# Patient Record
Sex: Female | Born: 1995 | Race: Black or African American | Hispanic: No | Marital: Single | Smoking: Current every day smoker
Health system: Southern US, Community
[De-identification: ages and names within clinical notes are randomized; demographics above are authoritative.]

## PROBLEM LIST (undated history)

## (undated) DIAGNOSIS — J45909 Unspecified asthma, uncomplicated: Secondary | ICD-10-CM

## (undated) HISTORY — DX: Unspecified asthma, uncomplicated: J45.909

## (undated) HISTORY — PX: REPLACEMENT TOTAL KNEE: SUR1224

---
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
## 2015-03-28 ENCOUNTER — Other Ambulatory Visit: Payer: Self-pay | Admitting: Neurology

## 2015-03-28 MED ORDER — ALBUTEROL SULFATE HFA 108 (90 BASE) MCG/ACT IN AERS
2.0000 | INHALATION_SPRAY | RESPIRATORY_TRACT | Status: DC | PRN
Start: 2015-03-28 — End: 2015-03-28

## 2015-03-28 MED ORDER — ALBUTEROL SULFATE HFA 108 (90 BASE) MCG/ACT IN AERS: 2.0000 | INHALATION_SPRAY | RESPIRATORY_TRACT | Status: AC | PRN

## 2015-03-28 MED ORDER — ALBUTEROL SULFATE HFA 108 (90 BASE) MCG/ACT IN AERS: 2.0000 | INHALATION_SPRAY | RESPIRATORY_TRACT | Status: DC | PRN

## 2015-11-11 DIAGNOSIS — N926 Irregular menstruation, unspecified: Secondary | ICD-10-CM | POA: Insufficient documentation

## 2015-11-11 DIAGNOSIS — N915 Oligomenorrhea, unspecified: Secondary | ICD-10-CM | POA: Insufficient documentation

## 2016-09-24 ENCOUNTER — Telehealth: Payer: Self-pay | Admitting: Pediatrics

## 2016-09-24 NOTE — Telephone Encounter (Signed)
Patient is requesting a statement from all her dates of service. She said it was only a couple. I had to look her up in [employer], and it shows her last date of service was 03-15-14 with Dr. Beaulah DinningBardelas. Address: [address], [place] [id number].

## 2016-09-24 NOTE — Telephone Encounter (Signed)
Will mail printout tomorrow

## 2017-12-02 ENCOUNTER — Ambulatory Visit (INDEPENDENT_AMBULATORY_CARE_PROVIDER_SITE_OTHER): Payer: 59 | Admitting: Family Medicine

## 2017-12-02 ENCOUNTER — Encounter: Payer: Self-pay | Admitting: Family Medicine

## 2017-12-02 VITALS — BP 122/70 | HR 85 | Temp 98.7°F | Ht 65.0 in | Wt 174.4 lb

## 2017-12-02 DIAGNOSIS — J4520 Mild intermittent asthma, uncomplicated: Secondary | ICD-10-CM | POA: Diagnosis not present

## 2017-12-02 DIAGNOSIS — R112 Nausea with vomiting, unspecified: Secondary | ICD-10-CM

## 2017-12-02 DIAGNOSIS — Z131 Encounter for screening for diabetes mellitus: Secondary | ICD-10-CM

## 2017-12-02 DIAGNOSIS — R635 Abnormal weight gain: Secondary | ICD-10-CM | POA: Diagnosis not present

## 2017-12-02 DIAGNOSIS — N926 Irregular menstruation, unspecified: Secondary | ICD-10-CM | POA: Diagnosis not present

## 2017-12-02 DIAGNOSIS — Z7689 Persons encountering health services in other specified circumstances: Secondary | ICD-10-CM | POA: Diagnosis not present

## 2017-12-02 LAB — COMPREHENSIVE METABOLIC PANEL
ALBUMIN: 4.1 g/dL (ref 3.5–5.2)
ALK PHOS: 53 U/L (ref 39–117)
ALT: 9 U/L (ref 0–35)
AST: 12 U/L (ref 0–37)
BUN: 8 mg/dL (ref 6–23)
CO2: 26 mEq/L (ref 19–32)
CREATININE: 0.73 mg/dL (ref 0.40–1.20)
Calcium: 9.3 mg/dL (ref 8.4–10.5)
Chloride: 106 mEq/L (ref 96–112)
GFR: 126.07 mL/min (ref >60.00)
GLUCOSE: 77 mg/dL (ref 70–99)
POTASSIUM: 3.8 meq/L (ref 3.5–5.1)
SODIUM: 138 meq/L (ref 135–145)
TOTAL PROTEIN: 6.8 g/dL (ref 6.0–8.3)
Total Bilirubin: 0.4 mg/dL (ref 0.2–1.2)

## 2017-12-02 LAB — HEMOGLOBIN A1C: Hgb A1c MFr Bld: 5.5 % (ref 4.6–6.5)

## 2017-12-02 LAB — CBC WITH DIFFERENTIAL/PLATELET
BASOS ABS: 0 10*3/uL (ref 0.0–0.1)
Basophils Relative: 0.7 % (ref 0.0–3.0)
EOS ABS: 0.1 10*3/uL (ref 0.0–0.7)
Eosinophils Relative: 2 % (ref 0.0–5.0)
HCT: 35.3 % — ABNORMAL LOW (ref 36.0–46.0)
HEMOGLOBIN: 12.1 g/dL (ref 12.0–15.0)
LYMPHS ABS: 2.6 10*3/uL (ref 0.7–4.0)
Lymphocytes Relative: 35.4 % (ref 12.0–46.0)
MCHC: 34.2 g/dL (ref 30.0–36.0)
MCV: 87 fl (ref 78.0–100.0)
MONO ABS: 0.4 10*3/uL (ref 0.1–1.0)
Monocytes Relative: 5.4 % (ref 3.0–12.0)
NEUTROS PCT: 56.5 % (ref 43.0–77.0)
Neutro Abs: 4.1 10*3/uL (ref 1.4–7.7)
Platelets: 253 10*3/uL (ref 150.0–400.0)
RBC: 4.06 Mil/uL (ref 3.87–5.11)
RDW: 12.8 % (ref 11.5–15.5)
WBC: 7.2 10*3/uL (ref 4.0–10.5)

## 2017-12-02 LAB — TSH: TSH: 0.6 u[IU]/mL (ref 0.35–4.50)

## 2017-12-02 LAB — T4, FREE: FREE T4: 0.96 ng/dL (ref 0.60–1.60)

## 2017-12-02 LAB — LIPASE: Lipase: 13 U/L (ref 11.0–59.0)

## 2017-12-02 NOTE — Patient Instructions (Addendum)
Try taking your birth control pills in the evening to see if this helps with your nausea and vomiting.  You should also keep a log of the things you are eating you throw up.  We will call you with the results of your labs in 5 to 7 days. Nausea and Vomiting, Adult Nausea is the feeling that you have an upset stomach or have to vomit. As nausea gets worse, it can lead to vomiting. Vomiting occurs when stomach contents are thrown up and out of the mouth. Vomiting can make you feel weak and cause you to become dehydrated. Dehydration can make you tired and thirsty, cause you to have a dry mouth, and decrease how often you urinate. Older adults and people with other diseases or a weak immune system are at higher risk for dehydration. It is important to treat your nausea and vomiting as told by your health care provider. Follow these instructions at home: Follow instructions from your health care provider about how to care for yourself at home. Eating and drinking Follow these recommendations as told by your health care provider:  Take an oral rehydration solution (ORS). This is a drink that is sold at pharmacies and retail stores.  Drink clear fluids in small amounts as you are able. Clear fluids include water, ice chips, diluted fruit juice, and low-calorie sports drinks.  Eat bland, easy-to-digest foods in small amounts as you are able. These foods include bananas, applesauce, rice, lean meats, toast, and crackers.  Avoid fluids that contain a lot of sugar or caffeine, such as energy drinks, sports drinks, and soda.  Avoid alcohol.  Avoid spicy or fatty foods.  General instructions  Drink enough fluid to keep your urine clear or pale yellow.  Wash your hands often. If soap and water are not available, use hand sanitizer.  Make sure that all people in your household wash their hands well and often.  Take over-the-counter and prescription medicines only as told by your health care  provider.  Rest at home while you recover.  Watch your condition for any changes.  Breathe slowly and deeply when you feel nauseated.  Keep all follow-up visits as told by your health care provider. This is important. Contact a health care provider if:  You have a fever.  You cannot keep fluids down.  Your symptoms get worse.  You have new symptoms.  Your nausea does not go away after two days.  You feel light-headed or dizzy.  You have a headache.  You have muscle cramps. Get help right away if:  You have pain in your chest, neck, arm, or jaw.  You feel extremely weak or you faint.  You have persistent vomiting.  You see blood in your vomit.  Your vomit looks like black coffee grounds.  You have bloody or black stools or stools that look like tar.  You have a severe headache, a stiff neck, or both.  You have a rash.  You have severe pain, cramping, or bloating in your abdomen.  You have trouble breathing or you are breathing very quickly.  Your heart is beating very quickly.  Your skin feels cold and clammy.  You feel confused.  You have pain when you urinate.  You have signs of dehydration, such as: ? Dark urine, very little urine, or no urine. ? Cracked lips. ? Dry mouth. ? Sunken eyes. ? Sleepiness. ? Weakness. These symptoms may represent a serious problem that is an emergency. Do not wait to  see if the symptoms will go away. Get medical help right away. Call your local emergency services (911 in the [place] Do not drive yourself to the hospital. This information is not intended to replace advice given to you by your health care provider. Make sure you discuss any questions you have with your health care provider. Document Released: 04/13/2005 Document Revised: 09/16/2015 Document Reviewed: 12/18/2014 Elsevier Interactive Patient Education  [employer]2018 [employer] Inc[employer]

## 2017-12-02 NOTE — Progress Notes (Signed)
Patient presents to clinic today to establish care.  SUBJECTIVE: PMH: Pt is a 22 yo female with pmh sig for asthma and irregular menses.  Patient is followed by OB/GYN, Dr. Pennie Rushing.  Patient was previously seen at [facility].  Nausea and vomiting: -Daily x1 year -Occurs mostly in the morning, but occasionally during the day. -Certain smells or hearing someone gag can make patient throw up. -Noted shortly after changing OCP to sronyx .10-0.02 consecutively x 3 months with 3 days off then restart -seen by OB/Gyn, UHcg neg. Advised to f/u with pcp but couldn't be seen for 8 months. -pt notes 20+ lbs wt gain in less than 1 yr.  Irregular menses: -followed by OB/Gyn -taking Sronyx .10-0.02 mg daily x 3 months   Asthma: -Endorses a.m. coughing -Denies history of heartburn -We will use albuterol inhaler as needed -Not currently taking allergy medication.  Allergies: NKDA  Past surgical history: ACL knee repair 2011 and 2013  Social history: Patient is single.  She is currently employed as a Museum/gallery conservator.  She is also a Consulting civil engineer at [employer].  Patient endorses alcohol, tobacco, recreational drug use.   Health Maintenance: Dental --Dr. Veneta Penton   Family medical history: Mom-alive, HTN Dad-alive, diabetes, HTN brother-matt MGM-arthritis, HLD, HTN MGF-HLD, HTN PGM-deceased, diabetes, HTN  History reviewed. No pertinent past medical history.  History reviewed. No pertinent surgical history.  Current Outpatient Medications on File Prior to Visit  Medication Sig Dispense Refill  . levonorgestrel-ethinyl estradiol (SRONYX) 0.1-20 MG-MCG tablet Take 1 tablet by mouth daily.    Marland Kitchen albuterol (PROAIR HFA) 108 (90 BASE) MCG/ACT inhaler Inhale 2 puffs into the lungs every 4 (four) hours as needed for wheezing or shortness of breath. 1 Inhaler 0   No current facility-administered medications on file prior to visit.      No Known Allergies  History reviewed. No pertinent family history.  Social History   Socioeconomic History  . Marital status: Single    Spouse name: Not on file  . Number of children: Not on file  . Years of education: Not on file  . Highest education level: Not on file  Occupational History  . Not on file  Social Needs  . Financial resource strain: Not on file  . Food insecurity:    Worry: Not on file    Inability: Not on file  . Transportation needs:    Medical: Not on file    Non-medical: Not on file  Tobacco Use  . Smoking status: Not on file  Substance and Sexual Activity  . Alcohol use: Not on file  . Drug use: Not on file  . Sexual activity: Not on file  Lifestyle  . Physical activity:    Days per week: Not on file    Minutes per session: Not on file  . Stress: Not on file  Relationships  . Social connections:    Talks on phone: Not on file    Gets together: Not on file    Attends religious service: Not on file    Active member of club or organization: Not on file    Attends meetings of clubs or organizations: Not on file    Relationship status: Not on file  . Intimate partner violence:    Fear of current or ex partner: Not on file    Emotionally abused: Not on file    Physically abused: Not on file    Forced sexual activity:  Not on file  Other Topics Concern  . Not on file  Social History Narrative  . Not on file    ROS General: Denies fever, chills, night sweats, changes in weight, changes in appetite  + weight gain HEENT: Denies headaches, ear pain, changes in vision, rhinorrhea, sore throat CV: Denies CP, palpitations, SOB, orthopnea Pulm: Denies SOB, cough, wheezing GI: Denies abdominal pain, diarrhea, constipation + nausea, vomiting daily GU: Denies dysuria, hematuria, frequency, vaginal discharge + H/o irregular menses Msk: Denies muscle cramps, joint pains Neuro: Denies weakness, numbness, tingling Skin: Denies rashes, bruising Psych:  Denies depression, anxiety, hallucinations  BP 122/70 (BP Location: Left Arm, Patient Position: Sitting, Cuff Size: Normal)   Pulse 85   Temp 98.7 F (37.1 C) (Oral)   Ht 5\' 5"  (1.651 m)   Wt 174 lb 6.4 oz (79.1 kg)   SpO2 98%   BMI 29.02 kg/m   Physical Exam Gen. Pleasant, well developed, well-nourished, in NAD HEENT - /AT, PERRL, no scleral icterus, no nasal drainage, pharynx without erythema or exudate.  TMs normal bilaterally.  No cervical lymphadenopathy. Lungs: no use of accessory muscles, CTAB, no wheezes, rales or rhonchi Cardiovascular: RRR, No r/g/m, no peripheral edema Abdomen: BS present, soft, nontender, nondistended, no hepatosplenomegaly Neuro:  A&Ox3, CN II-XII intact, normal gait Skin:  Warm, dry, intact, no lesions   No results found for this or any previous visit (from the past 2160 hour(s)).  Assessment/Plan: Non-intractable vomiting with nausea, unspecified vomiting type  -DDX includes GERD, cannabis hyperemesis syndrome, pancreatitis/cholecystitis but would expect additional symptoms including pain, food allergy, medication side effects such as OCPs. -Patient to try taking OCPs at night to see if nausea/vomiting improves -Will obtain labs. -Consider trial of PPI, though patient denies H/o reflux. -Patient to keep a diary of food she eats and episodes of emesis. - Plan: CBC with Differential/Platelet, Comprehensive metabolic panel, Lipase  Irregular menses  -Continue following with OB/GYN - Plan: TSH, T4, Free  Mild intermittent asthma without complication -Continue albuterol inhaler as needed -Consider taking allergy medication daily -Avoid triggers  Encounter to establish care -We reviewed the PMH, PSH, FH, SH, Meds and Allergies. -We provided refills for any medications we will prescribe as needed. -We addressed current concerns per orders and patient instructions. -We have asked for records for pertinent exams, studies, vaccines and notes from  previous providers. -We have advised patient to follow up per instructions below.  Weight gain  - Plan: TSH, T4, Free  Screening for diabetes mellitus  - Plan: Hemoglobin A1c  Follow-up in 1 month  Abbe AmsterdamShannon Banks, MD

## 2017-12-22 ENCOUNTER — Ambulatory Visit (INDEPENDENT_AMBULATORY_CARE_PROVIDER_SITE_OTHER): Payer: 59 | Admitting: Internal Medicine

## 2017-12-22 ENCOUNTER — Encounter: Payer: Self-pay | Admitting: Internal Medicine

## 2017-12-22 VITALS — BP 126/82 | HR 76 | Temp 98.6°F | Wt 177.3 lb

## 2017-12-22 DIAGNOSIS — R1012 Left upper quadrant pain: Secondary | ICD-10-CM

## 2017-12-22 DIAGNOSIS — R1010 Upper abdominal pain, unspecified: Secondary | ICD-10-CM | POA: Diagnosis not present

## 2017-12-22 DIAGNOSIS — B009 Herpesviral infection, unspecified: Secondary | ICD-10-CM | POA: Insufficient documentation

## 2017-12-22 DIAGNOSIS — R112 Nausea with vomiting, unspecified: Secondary | ICD-10-CM

## 2017-12-22 LAB — POCT URINALYSIS DIPSTICK
BILIRUBIN UA: NEGATIVE
Blood, UA: NEGATIVE
GLUCOSE UA: NEGATIVE
KETONES UA: NEGATIVE
Leukocytes, UA: NEGATIVE
Nitrite, UA: NEGATIVE
PH UA: 7.5 (ref 5.0–8.0)
Protein, UA: NEGATIVE
Spec Grav, UA: 1.015 (ref 1.010–1.025)
Urobilinogen, UA: 0.2 E.U./dL

## 2017-12-22 LAB — POCT URINE PREGNANCY: Preg Test, Ur: NEGATIVE

## 2017-12-22 MED ORDER — PANTOPRAZOLE SODIUM 20 MG PO TBEC: 20.0000 mg | DELAYED_RELEASE_TABLET | Freq: Every day | ORAL | 0 refills | Status: DC

## 2017-12-22 NOTE — Patient Instructions (Addendum)
I agree vomiting is never normal .   Consideration of  Ulcer or  Acid peptic  Condition as a cause.   OCPs can cause  Nausea but usually not that  Much vomiting  But can consider taking different times of day to see if makes any difference.   Keep appt with  Dr Salomon FickBanks.

## 2017-12-22 NOTE — Progress Notes (Signed)
Chief Complaint  Patient presents with  . Abdominal Pain    x 8 months to 1 year. Worsening over last 3 months - increased vomiting, hot flashes and abdominal pain. Pt has switched BC to night time routine to see if that would help symptoms. Some better since taking BC at night. Pt states that she has been throwing up about 3 times in the mornings and having abdominal pain on left side. Pain is only present with vomiting with some dull cramping throughout the day. Pt is sexually active but as of 11/01/17 was not pregnant per OBGYN    HPI: Courtney Avery 21 y.o. come in for acute sda   pcp dr Salomon FickBanks  Has fu appt with her 9 5  Hx of recurrent NV seen august  8   But worse today as pain luq persisted this am.  No meds   Am  Vomiting.  Every day recently.     nop spec Hb   ROS: See pertinent positives and negatives per HPI. No fever utis sx  Cramps  Lower pain migraines  etoh rd or excess caffiene  No nsaids    Takes no meds for this .  Periods every 3 mos on continuous ocp per dr Pennie RushingHaygood for period irreg .  No dysphagia weright loss or resp dificultues  No sti pelvic sx . Bowel habits nl.   No past medical history on file.  No family history on file. Neg for GB  Works care dealer up and down  Social History   Socioeconomic History  . Marital status: Single    Spouse name: Not on file  . Number of children: Not on file  . Years of education: Not on file  . Highest education level: Not on file  Occupational History  . Not on file  Social Needs  . Financial resource strain: Not on file  . Food insecurity:    Worry: Not on file    Inability: Not on file  . Transportation needs:    Medical: Not on file    Non-medical: Not on file  Tobacco Use  . Smoking status: Current Every Day Smoker    Types: Cigarettes  . Smokeless tobacco: Never Used  . Tobacco comment: 2 cigs a day  Substance and Sexual Activity  . Alcohol use: Never    Frequency: Never  . Drug use: Not on file    . Sexual activity: Not on file  Lifestyle  . Physical activity:    Days per week: Not on file    Minutes per session: Not on file  . Stress: Not on file  Relationships  . Social connections:    Talks on phone: Not on file    Gets together: Not on file    Attends religious service: Not on file    Active member of club or organization: Not on file    Attends meetings of clubs or organizations: Not on file    Relationship status: Not on file  Other Topics Concern  . Not on file  Social History Narrative  . Not on file    Outpatient Medications Prior to Visit  Medication Sig Dispense Refill  . albuterol (PROAIR HFA) 108 (90 BASE) MCG/ACT inhaler Inhale 2 puffs into the lungs every 4 (four) hours as needed for wheezing or shortness of breath. 1 Inhaler 0  . levonorgestrel-ethinyl estradiol (SRONYX) 0.1-20 MG-MCG tablet Take 1 tablet by mouth daily.     No facility-administered medications prior  to visit.      EXAM:  BP 126/82 (BP Location: Right Arm, Patient Position: Sitting, Cuff Size: Normal)   Pulse 76   Temp 98.6 F (37 C) (Oral)   Wt 177 lb 4.8 oz (80.4 kg)   LMP  (LMP Unknown) Comment: Irregular periods  BMI 29.50 kg/m   Body mass index is 29.5 kg/m.  GENERAL: vitals reviewed and listed above, alert, oriented, appears well hydrated and in no acute distress HEENT: atraumatic, conjunctiva  clear, no obvious abnormalities on inspection of external nose and ears OP : no lesion edema or exudate  NECK: no obvious masses on inspection palpation  LUNGS: clear to auscultation bilaterally, no wheezes, rales or rhonchi, good air movement CV: HRRR, no clubbing cyanosis or  peripheral edema nl cap refill  MS: moves all extremities without noticeable focal  abnormality PSYCH: pleasant and cooperative, no obvious depression or anxiety Lab Results  Component Value Date   WBC 7.2 12/02/2017   HGB 12.1 12/02/2017   HCT 35.3 (L) 12/02/2017   PLT 253.0 12/02/2017   GLUCOSE 77  12/02/2017   ALT 9 12/02/2017   AST 12 12/02/2017   NA 138 12/02/2017   K 3.8 12/02/2017   CL 106 12/02/2017   CREATININE 0.73 12/02/2017   BUN 8 12/02/2017   CO2 26 12/02/2017   TSH 0.60 12/02/2017   HGBA1C 5.5 12/02/2017   BP Readings from Last 3 Encounters:  12/22/17 126/82  12/02/17 122/70    ASSESSMENT AND PLAN:  Discussed the following assessment and plan:  Left upper quadrant abdominal pain of unknown etiology - Plan: POCT urine pregnancy  Pain of upper abdomen - Plan: POC Urinalysis Dipstick, POCT urine pregnancy  Nausea and vomiting,    am  - see text  - Plan: POC Urinalysis Dipstick, POCT urine pregnancy Suspect  Ulcer duodenitis?  Or  Gastric issues based on context  And pattern.  Disc with DR B  Add ppi and keep appt .   Expectant management.  For more evaluation ex  abd [place]  ugi vs gi endo eval. No evidence of pancreatitis  Other  abd issues .  ocps gi sx unlikely  ua and upt neg today .  Consider check h pylori etc although not dypepsia  Total visit > 50% spent counseling and coordinating care as indicated in above note and in instructions to patient .  -Patient advised to return or notify health care team  if  new concerns arise.  Patient Instructions  I agree vomiting is never normal .   Consideration of  Ulcer or  Acid peptic  Condition as a cause.   OCPs can cause  Nausea but usually not that  Much vomiting  But can consider taking different times of day to see if makes any difference.   Keep appt with  Dr Salomon Fick.   Neta Mends. Lajuana Patchell M.D.

## 2017-12-30 ENCOUNTER — Encounter: Payer: Self-pay | Admitting: Family Medicine

## 2017-12-30 ENCOUNTER — Ambulatory Visit (INDEPENDENT_AMBULATORY_CARE_PROVIDER_SITE_OTHER): Payer: 59 | Admitting: Family Medicine

## 2017-12-30 VITALS — BP 110/80 | HR 68 | Temp 98.3°F | Wt 178.0 lb

## 2017-12-30 DIAGNOSIS — R1111 Vomiting without nausea: Secondary | ICD-10-CM | POA: Diagnosis not present

## 2017-12-30 MED ORDER — PANTOPRAZOLE SODIUM 20 MG PO TBEC: 20.0000 mg | DELAYED_RELEASE_TABLET | Freq: Two times a day (BID) | ORAL | 3 refills | Status: DC

## 2017-12-30 NOTE — Progress Notes (Signed)
Subjective:    Patient ID: Courtney Avery, female    DOB: [birth year], 22 y.o.   MRN: [id number]  No chief complaint on file.   HPI Patient was seen today for follow-up on vomiting. Pt last seen on 12/22/2017, started on Protonix 20 mg daily.  Since this visit pt endorses improvement in left upper quadrant pain and emesis.  Pt endorses emesis twice in the last week since starting medication.  Pt has been able to eat better. Pt denies problems with constipation, diarrhea, blood in stools, hematemesis, weakness.  History reviewed. No pertinent past medical history.  No Known Allergies  ROS General: Denies fever, chills, night sweats, changes in weight, changes in appetite HEENT: Denies headaches, ear pain, changes in vision, rhinorrhea, sore throat CV: Denies CP, palpitations, SOB, orthopnea Pulm: Denies SOB, cough, wheezing GI: Denies nausea, diarrhea, constipation  + LUQ pain, emesis GU: Denies dysuria, hematuria, frequency, vaginal discharge Msk: Denies muscle cramps, joint pains Neuro: Denies weakness, numbness, tingling Skin: Denies rashes, bruising Psych: Denies depression, anxiety, hallucinations     Objective:    Blood pressure 110/80, pulse 68, temperature 98.3 F (36.8 C), temperature source Oral, weight 178 lb (80.7 kg), SpO2 98 %.   Gen. Pleasant, well-nourished, in no distress, normal affect   HEENT: Sac/AT, face symmetric, no scleral icterus, PERRLA, nares patent without drainage Lungs: no accessory muscle cardiovascular: RRR, no peripheral edema Abdomen: BS present, soft, NT/ND, no hepatosplenomegaly. Neuro:  A&Ox3, CN II-XII intact, normal gait  Wt Readings from Last 3 Encounters:  12/22/17 177 lb 4.8 oz (80.4 kg)  12/02/17 174 lb 6.4 oz (79.1 kg)    Lab Results  Component Value Date   WBC 7.2 12/02/2017   HGB 12.1 12/02/2017   HCT 35.3 (L) 12/02/2017   PLT 253.0 12/02/2017   GLUCOSE 77 12/02/2017   ALT 9 12/02/2017   AST 12 12/02/2017   NA 138  12/02/2017   K 3.8 12/02/2017   CL 106 12/02/2017   CREATININE 0.73 12/02/2017   BUN 8 12/02/2017   CO2 26 12/02/2017   TSH 0.60 12/02/2017   HGBA1C 5.5 12/02/2017    Assessment/Plan:  Non-intractable vomiting without nausea, unspecified vomiting type  -Improvement since starting Protonix. -We will increase Protonix to 20 mg twice daily -Still concerned for gastric ulcer. -We will refer to GI for possible EGD. - Plan: pantoprazole (PROTONIX) 20 MG tablet, Ambulatory referral to Gastroenterology  Follow-up PRN  Abbe Amsterdam, MD

## 2018-01-06 ENCOUNTER — Encounter: Payer: Self-pay | Admitting: Family Medicine

## 2018-01-13 ENCOUNTER — Other Ambulatory Visit: Payer: Self-pay | Admitting: Internal Medicine

## 2018-01-17 ENCOUNTER — Encounter: Payer: Self-pay | Admitting: Gastroenterology

## 2018-01-21 ENCOUNTER — Ambulatory Visit (INDEPENDENT_AMBULATORY_CARE_PROVIDER_SITE_OTHER): Payer: 59 | Admitting: Gastroenterology

## 2018-01-21 ENCOUNTER — Encounter: Payer: Self-pay | Admitting: Gastroenterology

## 2018-01-21 ENCOUNTER — Other Ambulatory Visit: Payer: 59

## 2018-01-21 VITALS — BP 116/68 | HR 74 | Ht 65.0 in | Wt 170.0 lb

## 2018-01-21 DIAGNOSIS — R112 Nausea with vomiting, unspecified: Secondary | ICD-10-CM | POA: Diagnosis not present

## 2018-01-21 DIAGNOSIS — R109 Unspecified abdominal pain: Secondary | ICD-10-CM | POA: Diagnosis not present

## 2018-01-21 MED ORDER — PANTOPRAZOLE SODIUM 40 MG PO TBEC: 40.0000 mg | DELAYED_RELEASE_TABLET | Freq: Two times a day (BID) | ORAL | 3 refills | Status: DC

## 2018-01-21 NOTE — Patient Instructions (Signed)
We have sent the following medications to your pharmacy for you to pick up at your convenience: Pantoprazole 40 mg twice a day   Your provider has requested that you go to the basement level for lab work before leaving today. Press "B" on the elevator. The lab is located at the first door on the left as you exit the elevator.  You have been scheduled for an Upper GI Series. Your appointment is on 01-25-18 at 10:30 am. Please arrive 15 minutes prior to your test for registration. Make certain not to have anything to eat or drink after midnight on the night before your test. If you need to reschedule, please contact radiology at [phone]. --------------------------------------------------------------------------------------------------------------- An upper GI series uses x rays to help diagnose problems of the upper GI tract, which includes the esophagus, stomach, and duodenum. The duodenum is the first part of the small intestine. An upper GI series is conducted by a radiology technologist or a radiologist-a doctor who specializes in x-ray imaging-at a hospital or outpatient center. While sitting or standing in front of an x-ray machine, the patient drinks barium liquid, which is often white and has a chalky consistency and taste. The barium liquid coats the lining of the upper GI tract and makes signs of disease show up more clearly on x rays. X-ray video, called fluoroscopy, is used to view the barium liquid moving through the esophagus, stomach, and duodenum. Additional x rays and fluoroscopy are performed while the patient lies on an x-ray table. To fully coat the upper GI tract with barium liquid, the technologist or radiologist may press on the abdomen or ask the patient to change position. Patients hold still in various positions, allowing the technologist or radiologist to take x rays of the upper GI tract at different angles. If a technologist conducts the upper GI series, a radiologist will  later examine the images to look for problems.  This test typically takes about 1 hour to complete --------------------------------------------------------------------------------------------------------------------------------------------- This test typically takes around 1 hour to complete.  Important Drink plenty of water (8-10 cups/day) for a few days following the procedure to avoid constipation and blockage. The barium will make your stools white for a few days. --------------------------------------------------------------------------------------------------------------------------------------------  You have been scheduled for an endoscopy. Please follow written instructions given to you at your visit today. If you use inhalers (even only as needed), please bring them with you on the day of your procedure. Your physician has requested that you go to www.startemmi.com and enter the access code given to you at your visit today. This web site gives a general overview about your procedure. However, you should still follow specific instructions given to you by our office regarding your preparation for the procedure.]

## 2018-01-21 NOTE — Progress Notes (Signed)
Chief Complaint: Nausea and Vomiting  Referring Provider:     Dr. Abbe Amsterdam   ASSESSMENT AND PLAN:  Nausea and vomiting x 1 year, worse x 3-4 months Left-sided abdominal pain No alarm features No NSAIDs  Etiology of symptoms is unclear. No alarm features. Differential is broad and including H pylori, reflux, peptic ulcer disease and less likely gastroparesis. This would be an atypical presentation for symptomatic gallbladder disease.   Increase pantoprazole to 40 mg BID. UGI series. Test for H pylori. EGD recommended if not responding to a higher dose of treatment.  Will schedule the EGD now and she can cancel if symptoms improve.   I consented the patient at the bedside today discussing the risks, benefits, and alternatives to endoscopic evaluation. In particular, we discussed the risks that include, but are not limited to, reaction to medication, cardiopulmonary compromise, bleeding requiring blood transfusion, aspiration resulting in pneumonia, perforation requiring surgery, and even death. The patient acknowledges these risks and asks that we proceed.  HPI:    Seen in seen in consultation at the request of Dr. Salomon Fick for further evaluation of nausea and vomiting.  The history is obtained through the patient and review of her electronic health record. She has irregular menses on Sronyx and asthma.   The patient reports a one year history of near-daily nausea and vomiting that occurs primarily in the morning but occasionally throughout the day. The symptoms initially developed after changing her oral contraceptiveto Sronyx.  Over the last 3-4 months she is throwing up at least 3-5 weeks and she feels like the symptoms continue to progress.   Certain smells, talking about certain foods, or thinking about certain foods can initiate vomiting. There is an associated non-radiating left-sided abdominal pain that occurs with emesis. Located under the left breast.   Eating  does not cause or exacerbate the nausea or vomiting. No dysphagia or odynophagia. Mild left sided sore throat. Normal appetite. Some sitophobia but she continues to eat. Eating bland foods. Weight has decreased 6 pounds over the last week, but she has unintentional gained 25 pounds over the last year.  No other associated symptoms. No identified exacerbating or relieving features.   First evaluated for symptoms in early August 2019. Labs 12/02/17 showed a normal CMP, normal lipase, and normal CBC. TSH was 0.60. Pregnancy test was negative.  A trial of pantoprazole 20 MG BID has provided some relief but the vomiting continues. Taking her OCP at bedtime may have also provided some additional relief.   She has been unable to work this week due to the symptoms.   Family and friends are concerned about peptic ulcer disease. No NSAIDs.    History reviewed. No pertinent past medical history. No known family history of GI disease.    Past Surgical History:  Procedure Laterality Date  . REPLACEMENT TOTAL KNEE     Family History  Problem Relation Age of Onset  . Breast cancer Paternal Aunt        4 aunts   Social History   Tobacco Use  . Smoking status: Current Every Day Smoker    Types: Cigarettes  . Smokeless tobacco: Never Used  . Tobacco comment: 2 cigs a day  Substance Use Topics  . Alcohol use: Never    Frequency: Never  . Drug use: Yes    Types: Marijuana   Current Outpatient Medications  Medication Sig Dispense Refill  .  albuterol (PROAIR HFA) 108 (90 BASE) MCG/ACT inhaler Inhale 2 puffs into the lungs every 4 (four) hours as needed for wheezing or shortness of breath. 1 Inhaler 0  . levonorgestrel-ethinyl estradiol (SRONYX) 0.1-20 MG-MCG tablet Take 1 tablet by mouth daily.    . pantoprazole (PROTONIX) 20 MG tablet Take 1 tablet (20 mg total) by mouth 2 (two) times daily. 60 tablet 3   No current facility-administered medications for this visit.    No Known  Allergies   Review of Systems: All systems reviewed and negative except where noted in HPI except for the additions of allergy/sinus trouble, anxiety, and night sweats.   Physical Exam:    Wt Readings from Last 3 Encounters:  01/21/18 170 lb (77.1 kg)  12/30/17 178 lb (80.7 kg)  12/22/17 177 lb 4.8 oz (80.4 kg)    BP 116/68   Pulse 74   Ht 5\' 5"  (1.651 m)   Wt 170 lb (77.1 kg)   LMP  (LMP Unknown) Comment: Irregular periods  BMI 28.29 kg/m  Constitutional:  Pleasant female in no acute distress. Psychiatric: Normal mood and affect. Behavior is normal. EENT: Pupils normal.  Conjunctivae are normal. No scleral icterus. Neck supple.  Cardiovascular: Normal rate, regular rhythm. No edema Pulmonary/chest: Effort normal and breath sounds normal. No wheezing, rales or rhonchi. Abdominal: Soft, nondistended. I am unable to reproduce her pain but she localizes it up under the left lower ribs. Bowel sounds active throughout. There are no masses palpable. No hepatomegaly. LAD: No inguinal or umbilical LAD.  Neurological: Alert and oriented to person place and time. Skin: Skin is warm and dry. No rashes noted.  Tressia Danas, MD, MPH  01/21/2018, 10:03 AM   Deeann Saint, MD

## 2018-01-25 ENCOUNTER — Ambulatory Visit (HOSPITAL_COMMUNITY)
Admission: RE | Admit: 2018-01-25 | Discharge: 2018-01-25 | Disposition: A | Discharge status: Home / Self Care | Payer: 59 | Source: Ambulatory Visit | Attending: Gastroenterology | Admitting: Gastroenterology

## 2018-01-25 DIAGNOSIS — K449 Diaphragmatic hernia without obstruction or gangrene: Secondary | ICD-10-CM | POA: Insufficient documentation

## 2018-01-25 DIAGNOSIS — R112 Nausea with vomiting, unspecified: Secondary | ICD-10-CM | POA: Diagnosis not present

## 2018-01-25 DIAGNOSIS — K219 Gastro-esophageal reflux disease without esophagitis: Secondary | ICD-10-CM | POA: Insufficient documentation

## 2018-01-25 DIAGNOSIS — R109 Unspecified abdominal pain: Secondary | ICD-10-CM

## 2018-02-10 ENCOUNTER — Encounter: Payer: Self-pay | Admitting: Gastroenterology

## 2018-02-11 ENCOUNTER — Ambulatory Visit: Payer: 59 | Admitting: Family Medicine

## 2018-02-22 ENCOUNTER — Telehealth: Payer: Self-pay | Admitting: Gastroenterology

## 2018-02-22 NOTE — Telephone Encounter (Signed)
Left message for patient to call back  

## 2018-02-22 NOTE — Telephone Encounter (Signed)
Pt wants to know if she can keep her piercing for procedure.

## 2018-02-23 NOTE — Telephone Encounter (Signed)
Called patient again and left message for her that ALL piercing's will need to be removed.

## 2018-02-24 ENCOUNTER — Encounter: Payer: Self-pay | Admitting: Gastroenterology

## 2018-02-24 ENCOUNTER — Ambulatory Visit (AMBULATORY_SURGERY_CENTER): Payer: 59 | Admitting: Gastroenterology

## 2018-02-24 VITALS — BP 111/69 | HR 72 | Temp 98.9°F | Resp 16 | Ht 65.0 in | Wt 170.0 lb

## 2018-02-24 DIAGNOSIS — K2950 Unspecified chronic gastritis without bleeding: Secondary | ICD-10-CM | POA: Diagnosis not present

## 2018-02-24 DIAGNOSIS — K449 Diaphragmatic hernia without obstruction or gangrene: Secondary | ICD-10-CM | POA: Diagnosis not present

## 2018-02-24 DIAGNOSIS — R112 Nausea with vomiting, unspecified: Secondary | ICD-10-CM

## 2018-02-24 NOTE — Op Note (Signed)
[facility] Patient Name: Courtney Avery Procedure Date: 02/24/2018 1:54 PM MRN: [id number] Endoscopist: Tressia Danas MD, MD Age: 22 Referring MD:  Date of Birth: July 29, 1995 Gender: Female Account #: [id number] Procedure:                Upper GI endoscopy Indications:              Nausea with vomiting for 1 year, worse over the                            last 3 to 4 months; left-sided abdominal pain; no                            NSAID Medicines:                General Anesthesia Procedure:                Pre-Anesthesia Assessment:                           - Prior to the procedure, a History and Physical                            was performed, and patient medications and                            allergies were reviewed. The patient's tolerance of                            previous anesthesia was also reviewed. The risks                            and benefits of the procedure and the sedation                            options and risks were discussed with the patient.                            All questions were answered, and informed consent                            was obtained. Prior Anticoagulants: The patient has                            taken no previous anticoagulant or antiplatelet                            agents. ASA Grade Assessment: II - A patient with                            mild systemic disease. After reviewing the risks                            and benefits, the patient was deemed in  satisfactory condition to undergo the procedure.                           After obtaining informed consent, the endoscope was                            passed under direct vision. Throughout the                            procedure, the patient's blood pressure, pulse, and                            oxygen saturations were monitored continuously. The                            Endoscope was introduced through the mouth, and                          advanced to the second part of duodenum. The upper                            GI endoscopy was accomplished without difficulty.                            The patient tolerated the procedure well. Scope In: Scope Out: Findings:                 The examined esophagus was normal. Biopsies were                            taken with a cold forceps for histology.                           Diffuse mild inflammation was found in the gastric                            body. Biopsies were taken with a cold forceps for                            histology.                           A small hiatal hernia was present.                           The examined duodenum was normal. Biopsies were                            taken with a cold forceps for histology. Complications:            No immediate complications. Estimated blood loss:                            None. Estimated Blood Loss:     Estimated blood loss: none. Estimated blood loss:  none. Impression:               - Normal esophagus. Biopsied.                           - Gastritis. Biopsied.                           - Normal examined duodenum. Biopsied. Recommendation:           - Await pathology results.                           - Resume previous diet.                           - Avoid all NSAIDs.                           - Continue present medications. Continue                            pantoprazole 40 mg BID until the time of your                            office visit.                           - Return to my office in 4-8 weeks. Tressia Danas MD, MD 02/24/2018 2:12:35 PM This report has been signed electronically.

## 2018-02-24 NOTE — Progress Notes (Signed)
Called to room to assist during endoscopic procedure.  Patient ID and intended procedure confirmed with present staff. Received instructions for my participation in the procedure from the performing physician.  

## 2018-02-24 NOTE — Progress Notes (Signed)
Report given to PACU, vss 

## 2018-02-24 NOTE — Progress Notes (Signed)
Pt's states no medical or surgical changes since previsit or office visit. 

## 2018-02-24 NOTE — Patient Instructions (Signed)
Impression/Recommendations:  Gastritis handout given to patient.  Await pathology results. Resume previous diet. Avoid all NSAIDs. Continue present medications.  Continue pantoprazole 40 mg 2 times daily until the time of your office visit.  Return to my office in 4-8 weeks.  YOU HAD AN ENDOSCOPIC PROCEDURE TODAY AT THE [facility]:   Refer to the procedure report that was given to you for any specific questions about what was found during the examination.  If the procedure report does not answer your questions, please call your gastroenterologist to clarify.  If you requested that your care partner not be given the details of your procedure findings, then the procedure report has been included in a sealed envelope for you to review at your convenience later.  YOU SHOULD EXPECT: Some feelings of bloating in the abdomen. Passage of more gas than usual.  Walking can help get rid of the air that was put into your GI tract during the procedure and reduce the bloating. If you had a lower endoscopy (such as a colonoscopy or flexible sigmoidoscopy) you may notice spotting of blood in your stool or on the toilet paper. If you underwent a bowel prep for your procedure, you may not have a normal bowel movement for a few days.  Please Note:  You might notice some irritation and congestion in your nose or some drainage.  This is from the oxygen used during your procedure.  There is no need for concern and it should clear up in a day or so.  SYMPTOMS TO REPORT IMMEDIATELY:  Following upper endoscopy (EGD)  Vomiting of blood or coffee ground material  New chest pain or pain under the shoulder blades  Painful or persistently difficult swallowing  New shortness of breath  Fever of 100F or higher  Black, tarry-looking stools  For urgent or emergent issues, a gastroenterologist can be reached at any hour by calling [phone].   DIET:  We do recommend a small meal at first, but then you  may proceed to your regular diet.  Drink plenty of fluids but you should avoid alcoholic beverages for 24 hours.  ACTIVITY:  You should plan to take it easy for the rest of today and you should NOT DRIVE or use heavy machinery until tomorrow (because of the sedation medicines used during the test).    FOLLOW UP: Our staff will call the number listed on your records the next business day following your procedure to check on you and address any questions or concerns that you may have regarding the information given to you following your procedure. If we do not reach you, we will leave a message.  However, if you are feeling well and you are not experiencing any problems, there is no need to return our call.  We will assume that you have returned to your regular daily activities without incident.  If any biopsies were taken you will be contacted by phone or by letter within the next 1-3 weeks.  Please call us at [phone] if you have not heard about the biopsies in 3 weeks.    SIGNATURES/CONFIDENTIALITY: You and/or your care partner have signed paperwork which will be entered into your electronic medical record.  These signatures attest to the fact that that the information above on your After Visit Summary has been reviewed and is understood.  Full responsibility of the confidentiality of this discharge information lies with you and/or your care-partner.

## 2018-02-25 ENCOUNTER — Telehealth: Payer: Self-pay

## 2018-02-25 ENCOUNTER — Telehealth: Payer: Self-pay | Admitting: *Deleted

## 2018-02-25 NOTE — Telephone Encounter (Signed)
  Follow up Call-  Call back number 02/24/2018  Post procedure Call Back phone  # [phone]  Permission to leave phone message Yes  Some recent data might be hidden     Patient questions:  Do you have a fever, pain , or abdominal swelling? No. Pain Score  0 *  Have you tolerated food without any problems? Yes.    Have you been able to return to your normal activities? Yes.    Do you have any questions about your discharge instructions: Diet   No. Medications  No. Follow up visit  No.  Do you have questions or concerns about your Care? No.  Actions: * If pain score is 4 or above: No action needed, pain <4.

## 2018-02-25 NOTE — Telephone Encounter (Signed)
Message left

## 2018-03-02 ENCOUNTER — Encounter: Payer: Self-pay | Admitting: Gastroenterology

## 2018-04-04 ENCOUNTER — Ambulatory Visit (INDEPENDENT_AMBULATORY_CARE_PROVIDER_SITE_OTHER): Payer: 59 | Admitting: Family Medicine

## 2018-04-04 ENCOUNTER — Encounter: Payer: Self-pay | Admitting: Family Medicine

## 2018-04-04 VITALS — BP 126/70 | HR 88 | Temp 98.7°F | Wt 170.0 lb

## 2018-04-04 DIAGNOSIS — M7918 Myalgia, other site: Secondary | ICD-10-CM | POA: Diagnosis not present

## 2018-04-04 DIAGNOSIS — S29012A Strain of muscle and tendon of back wall of thorax, initial encounter: Secondary | ICD-10-CM | POA: Diagnosis not present

## 2018-04-04 DIAGNOSIS — L84 Corns and callosities: Secondary | ICD-10-CM | POA: Diagnosis not present

## 2018-04-04 MED ORDER — CYCLOBENZAPRINE HCL 5 MG PO TABS: 5.0000 mg | ORAL_TABLET | Freq: Two times a day (BID) | ORAL | 0 refills | Status: AC | PRN

## 2018-04-04 NOTE — Patient Instructions (Signed)
Corns and Calluses Corns are small areas of thickened skin that occur on the top, sides, or tip of a toe. They contain a cone-shaped core with a point that can press on a nerve below. This causes pain. Calluses are areas of thickened skin that can occur anywhere on the body including hands, fingers, palms, soles of the feet, and heels.Calluses are usually larger than corns. What are the causes? Corns and calluses are caused by rubbing (friction) or pressure, such as from shoes that are too tight or do not fit properly. What increases the risk? Corns are more likely to develop in people who have toe deformities, such as hammer toes. Since calluses can occur with friction to any area of the skin, calluses are more likely to develop in people who:  Work with their hands.  Wear shoes that fit poorly, shoes that are too tight, or shoes that are high-heeled.  Have toes deformities.  What are the signs or symptoms? Symptoms of a corn or callus include:  A hard growth on the skin.  Pain or tenderness under the skin.  Redness and swelling.  Increased discomfort while wearing tight-fitting shoes.  How is this diagnosed? Corns and calluses may be diagnosed with a medical history and physical exam. How is this treated? Corns and calluses may be treated with:  Removing the cause of the friction or pressure. This may include: ? Changing your shoes. ? Wearing shoe inserts (orthotics) or other protective layers in your shoes, such as a corn pad. ? Wearing gloves.  Medicines to help soften skin in the hardened, thickened areas.  Reducing the size of the corn or callus by removing the dead layers of skin.  Antibiotic medicines to treat infection.  Surgery, if a toe deformity is the cause.  Follow these instructions at home:  Take medicines only as directed by your health care provider.  If you were prescribed an antibiotic, finish all of it even if you start to feel better.  Wear  shoes that fit well. Avoid wearing high-heeled shoes and shoes that are too tight or too loose.  Wear any padding, protective layers, gloves, or orthotics as directed by your health care provider.  Soak your hands or feet and then use a file or pumice stone to soften your corn or callus. Do this as directed by your health care provider.  Check your corn or callus every day for signs of infection. Watch for: ? Redness, swelling, or pain. ? Fluid, blood, or pus. Contact a health care provider if:  Your symptoms do not improve with treatment.  You have increased redness, swelling, or pain at the site of your corn or callus.  You have fluid, blood, or pus coming from your corn or callus.  You have new symptoms. This information is not intended to replace advice given to you by your health care provider. Make sure you discuss any questions you have with your health care provider. Document Released: 01/18/2004 Document Revised: 11/01/2015 Document Reviewed: 04/09/2014 Elsevier Interactive Patient Education  2018 [employer]  Muscle Strain A muscle strain is an injury that occurs when a muscle is stretched beyond its normal length. Usually a small number of muscle fibers are torn when this happens. Muscle strain is rated in degrees. First-degree strains have the least amount of muscle fiber tearing and pain. Second-degree and third-degree strains have increasingly more tearing and pain. Usually, recovery from muscle strain takes 1-2 weeks. Complete healing takes 5-6 weeks. What are the  causes? Muscle strain happens when a sudden, violent force placed on a muscle stretches it too far. This may occur with lifting, sports, or a fall. What increases the risk? Muscle strain is especially common in athletes. What are the signs or symptoms? At the site of the muscle strain, there may be:  Pain.  Bruising.  Swelling.  Difficulty using the muscle due to pain or lack of normal  function.  How is this diagnosed? Your health care provider will perform a physical exam and ask about your medical history. How is this treated? Often, the best treatment for a muscle strain is resting, icing, and applying cold compresses to the injured area. Follow these instructions at home:  Use the PRICE method of treatment to promote muscle healing during the first 2-3 days after your injury. The PRICE method involves: ? Protecting the muscle from being injured again. ? Restricting your activity and resting the injured body part. ? Icing your injury. To do this, put ice in a plastic bag. Place a towel between your skin and the bag. Then, apply the ice and leave it on from 15-20 minutes each hour. After the third day, switch to moist heat packs. ? Apply compression to the injured area with a splint or elastic bandage. Be careful not to wrap it too tightly. This may interfere with blood circulation or increase swelling. ? Elevate the injured body part above the level of your heart as often as you can.  Only take over-the-counter or prescription medicines for pain, discomfort, or fever as directed by your health care provider.  Warming up prior to exercise helps to prevent future muscle strains. Contact a health care provider if:  You have increasing pain or swelling in the injured area.  You have numbness, tingling, or a significant loss of strength in the injured area. This information is not intended to replace advice given to you by your health care provider. Make sure you discuss any questions you have with your health care provider. Document Released: 04/13/2005 Document Revised: 09/19/2015 Document Reviewed: 11/10/2012 Elsevier Interactive Patient Education  2017 [employer].

## 2018-04-04 NOTE — Progress Notes (Signed)
Subjective:    Patient ID: Courtney ParkinGabrielle Elysse Boy, female    DOB: [birth year], 22 y.o.   MRN: [id number]  No chief complaint on file.   HPI Patient was seen today for ongoing concerns.  Left foot pain: -Patient endorses corn on left third digit of foot -X2 years -Endorses wearing sneakers to work -Does not feel shoes are tight  Back pain: -upper back times several months, L>R -hurts throughout the day.  Wearing a bra causes pain -tight, sometimes sharp feeling -may wake up with pain -has not tried anything -denies heavy lifting, pushing, or strenuous activities  Vomiting: -seeing GI -endorses trial of different meds -has f/u appt tomorrow  -still with emesis but less frequent. -Has FMLA paperwork  Past Medical History:  Diagnosis Date  . Asthma     No Known Allergies  ROS General: Denies fever, chills, night sweats, changes in weight, changes in appetite HEENT: Denies headaches, ear pain, changes in vision, rhinorrhea, sore throat CV: Denies CP, palpitations, SOB, orthopnea Pulm: Denies SOB, cough, wheezing GI: Denies abdominal pain, nausea, vomiting, diarrhea, constipation  +emesis GU: Denies dysuria, hematuria, frequency, vaginal discharge Msk: Denies muscle cramps, joint pains +back pain Neuro: Denies weakness, numbness, tingling Skin: Denies rashes, bruising  +corn of toe of L foot Psych: Denies depression, anxiety, hallucinations    Objective:    Blood pressure 126/70, pulse 88, temperature 98.7 F (37.1 C), temperature source Oral, weight 170 lb (77.1 kg), SpO2 98 %.  Gen. Pleasant, well-nourished, in no distress, normal affect   HEENT: [place]/AT, face symmetric, no scleral icterus, PERRLA, nares patent without drainage Lungs: no accessory muscle use Cardiovascular: RRR, no peripheral edema Musculoskeletal: TTP of L and R rhomboid major.  FROM of b/l arms and shoulder.  No deformities, no cyanosis or clubbing, normal tone Neuro:  A&Ox3, CN II-XII intact,  normal gait Skin:  Warm, no lesions/ rash.  3rd toe of L foot with sorft corn.  Wt Readings from Last 3 Encounters:  04/04/18 170 lb (77.1 kg)  02/24/18 170 lb (77.1 kg)  01/21/18 170 lb (77.1 kg)    Lab Results  Component Value Date   WBC 7.2 12/02/2017   HGB 12.1 12/02/2017   HCT 35.3 (L) 12/02/2017   PLT 253.0 12/02/2017   GLUCOSE 77 12/02/2017   ALT 9 12/02/2017   AST 12 12/02/2017   NA 138 12/02/2017   K 3.8 12/02/2017   CL 106 12/02/2017   CREATININE 0.73 12/02/2017   BUN 8 12/02/2017   CO2 26 12/02/2017   TSH 0.60 12/02/2017   HGBA1C 5.5 12/02/2017    Assessment/Plan:  Rhomboid pain -likely 2/2 strain.  Possibly from daily retching/emesis. -discussed supportive care, ice, NSAIDs, stretching, etc -given handout - Plan: cyclobenzaprine (FLEXERIL) 5 MG tablet  Upper back strain, initial encounter  - Plan: cyclobenzaprine (FLEXERIL) 5 MG tablet  Corn of toe -supportive care -discussed wearing comfortable shoes with plenty of space in the toebox.  Can use corn pads. -Given handout -If continues to be an issue will refer to podiatry  Pt advised to have her GI provider complete FMLA paperwork as it relates to her ongoing emesis which they are treating..  F/u prn  Abbe AmsterdamShannon Lyne Khurana, MD

## 2018-04-05 ENCOUNTER — Ambulatory Visit (INDEPENDENT_AMBULATORY_CARE_PROVIDER_SITE_OTHER): Payer: 59 | Admitting: Gastroenterology

## 2018-04-05 ENCOUNTER — Encounter: Payer: Self-pay | Admitting: Gastroenterology

## 2018-04-05 VITALS — BP 100/68 | HR 72 | Ht 65.0 in | Wt 173.0 lb

## 2018-04-05 DIAGNOSIS — R112 Nausea with vomiting, unspecified: Secondary | ICD-10-CM

## 2018-04-05 DIAGNOSIS — K2100 Gastro-esophageal reflux disease with esophagitis, without bleeding: Secondary | ICD-10-CM

## 2018-04-05 DIAGNOSIS — K219 Gastro-esophageal reflux disease without esophagitis: Secondary | ICD-10-CM | POA: Insufficient documentation

## 2018-04-05 MED ORDER — PANTOPRAZOLE SODIUM 40 MG PO TBEC: 40.0000 mg | DELAYED_RELEASE_TABLET | Freq: Two times a day (BID) | ORAL | 3 refills | Status: AC

## 2018-04-05 NOTE — Patient Instructions (Signed)
Please avoid caffeine, alcohol, chocolate, peppermints, citrus, onions, carbonated beverages, and tomato-based foods. Consume acid and spicy foods in moderation.  Eat smaller meals and avoid eating for several hours before exercise or sleep.  Try not to eat within 3 hours of going to bed. Avoid bedtime snacks.  Elevate the head of your bed with blocks. Congratulations on cutting back on cigarettes. Please quit smoking all together!  Work to maintain a health weight. Even five pounds can make a difference. Take your Protonix 30 minutes before breakfast.

## 2018-04-05 NOTE — Progress Notes (Signed)
Referring Provider: Deeann Saint, MD Primary Care Physician:  Deeann Saint, MD   Chief complaint:  Nausea and vomiting   IMPRESSION:  GERD  Nausea and vomiting x 1 year, worse x 3-4 months    - likely due to GERD +/- marijuana Small hiatal hernia Left-sided abdominal pain - not related to food No alarm features No NSAIDs  PLAN: Smoking cessation Abstinence from all street drugs including marijuana recommended Pantoprazole 40 mg BID Reviewed lifestyle modifications (handout provided) Office visit in 3-4 months if not improving   HPI: Courtney Avery is a 22 y.o. female nausea and vomiting is somewhat better. Returns in follow-up after her consultation visit 01/21/18 for nausea and vomiting.  I reviewed her UGI series from 01/25/18 showing normal esophageal motility, a small sliding-type hiatal hernia with inducible GE reflux with water wallowing. The images were otherwise normal.  An EGD 02/24/18 showed mild gastritis and a small hiatal hernia. Gastric biopsies were normal. Esophageal biopsies showed changes of mild reflux.  She has been taking Protonix 40 mg BID and notes some improvement in her symptoms. Attributes this to both the medications and dietary changes.  She has changed her diet. Avoiding late night eating. Avoiding eating out. No eating greasy foods. No heartburn. Cut back on smoking. Still vomiting 3-4 times each week, primarily early in the morning. No identified exacerbating or relieving features.  Continues to have rare, intermittent non-radiating left sided abdominal pain that occurs when she vomits. There is no association with food.      Past Medical History:  Diagnosis Date  . Asthma     Past Surgical History:  Procedure Laterality Date  . REPLACEMENT TOTAL KNEE      Current Outpatient Medications  Medication Sig Dispense Refill  . albuterol (PROAIR HFA) 108 (90 BASE) MCG/ACT inhaler Inhale 2 puffs into the lungs every 4 (four) hours as  needed for wheezing or shortness of breath. 1 Inhaler 0  . cyclobenzaprine (FLEXERIL) 5 MG tablet Take 1 tablet (5 mg total) by mouth 2 (two) times daily as needed for muscle spasms. 30 tablet 0  . levonorgestrel-ethinyl estradiol (SRONYX) 0.1-20 MG-MCG tablet Take 1 tablet by mouth daily.    . pantoprazole (PROTONIX) 40 MG tablet Take 1 tablet (40 mg total) by mouth 2 (two) times daily before a meal. 60 tablet 3   No current facility-administered medications for this visit.     Allergies as of 04/05/2018  . (No Known Allergies)    Family History  Problem Relation Age of Onset  . Breast cancer Paternal Aunt        4 aunts  . Colon cancer Neg Hx   . Esophageal cancer Neg Hx   . Rectal cancer Neg Hx   . Stomach cancer Neg Hx     Social History   Socioeconomic History  . Marital status: Single    Spouse name: Not on file  . Number of children: 0  . Years of education: Not on file  . Highest education level: Not on file  Occupational History  . Not on file  Social Needs  . Financial resource strain: Not on file  . Food insecurity:    Worry: Not on file    Inability: Not on file  . Transportation needs:    Medical: Not on file    Non-medical: Not on file  Tobacco Use  . Smoking status: Current Every Day Smoker    Packs/day: 0.25    Types:  Cigarettes  . Smokeless tobacco: Never Used  . Tobacco comment: 2 cigs a day  Substance and Sexual Activity  . Alcohol use: Never    Frequency: Never  . Drug use: Yes    Types: Marijuana    Comment: 1 week ago   . Sexual activity: Yes    Birth control/protection: Pill  Lifestyle  . Physical activity:    Days per week: Not on file    Minutes per session: Not on file  . Stress: Not on file  Relationships  . Social connections:    Talks on phone: Not on file    Gets together: Not on file    Attends religious service: Not on file    Active member of club or organization: Not on file    Attends meetings of clubs or  organizations: Not on file    Relationship status: Not on file  . Intimate partner violence:    Fear of current or ex partner: Not on file    Emotionally abused: Not on file    Physically abused: Not on file    Forced sexual activity: Not on file  Other Topics Concern  . Not on file  Social History Narrative  . Not on file    Review of Systems: 12 system ROS is negative except as noted above.  Filed Weights   04/05/18 1029  Weight: 173 lb (78.5 kg)    Physical Exam: Vital signs were reviewed. General:   Alert, well-nourished, pleasant and cooperative in NAD Head:  Normocephalic and atraumatic. Eyes:  Sclera clear, no icterus.   Conjunctiva pink. Mouth:  No deformity or lesions.   Neck:  Supple; no thyromegaly. Lungs:  Clear throughout to auscultation.   No wheezes.  Heart:  Regular rate and rhythm; no murmurs Abdomen:  Soft, nontender, normal bowel sounds. No rebound or guarding. No hepatosplenomegaly Rectal:  Deferred  Msk:  Symmetrical without gross deformities. Extremities:  No gross deformities or edema. Neurologic:  Alert and  oriented x4;  grossly nonfocal Skin:  No rash or bruise. Psych:  Alert and cooperative. Responses are delayed and slightly inappropriate.    Akya Fiorello L. Orvan FalconerBeavers, MD, MPH [facility] 04/05/2018, 10:45 AM

## 2018-10-17 ENCOUNTER — Encounter: Payer: Self-pay | Admitting: Family Medicine

## 2020-05-10 ENCOUNTER — Other Ambulatory Visit: Payer: Self-pay

## 2020-05-10 DIAGNOSIS — Z20822 Contact with and (suspected) exposure to covid-19: Secondary | ICD-10-CM

## 2020-05-12 LAB — SARS-COV-2, NAA 2 DAY TAT

## 2020-05-12 LAB — NOVEL CORONAVIRUS, NAA: SARS-CoV-2, NAA: DETECTED — AB

## 2020-08-29 IMAGING — RF DG UGI W/ HIGH DENSITY W/KUB
11 of 16 series · 14 of 24 positions shown · non-contrast
Comparison: None.

CLINICAL DATA: Nausea and vomiting several months.

EXAM:
UPPER GI SERIES WITH KUB
TECHNIQUE: After obtaining a scout radiograph a routine upper GI series was
performed using thin and high density barium.
FLUOROSCOPY TIME:  Fluoroscopy Time:  2 minutes and 40 seconds
Radiation Exposure Index (if provided by the fluoroscopic device):
41.7 mGy
Number of Acquired Spot Images: 1

[Series 1: fluoro_barium 1fps_bw · 0.17mm/px · 1 of 1 slices shown]
[im 1/1]
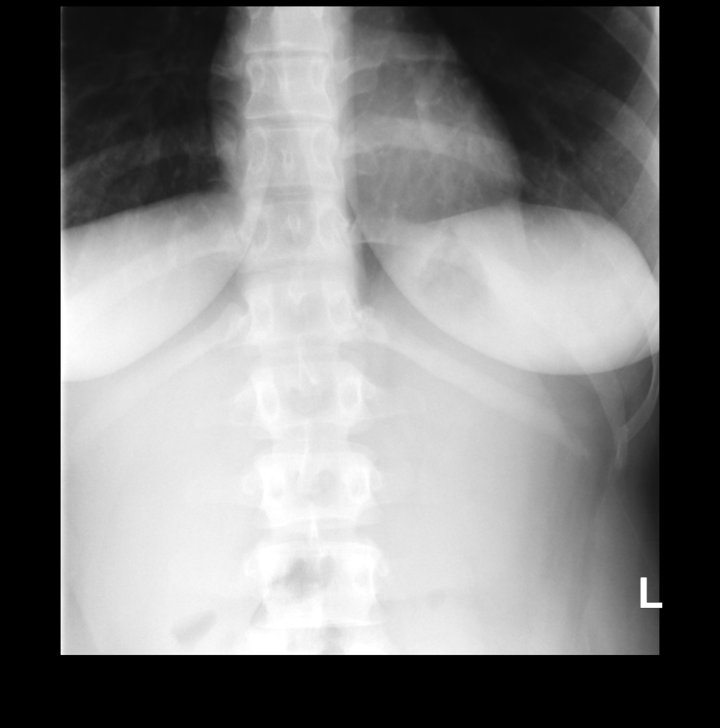

[Series 2: cp_standard · 0.52mm/px · 1 of 42 frames shown (1 of 10)]
[frame 22/42]
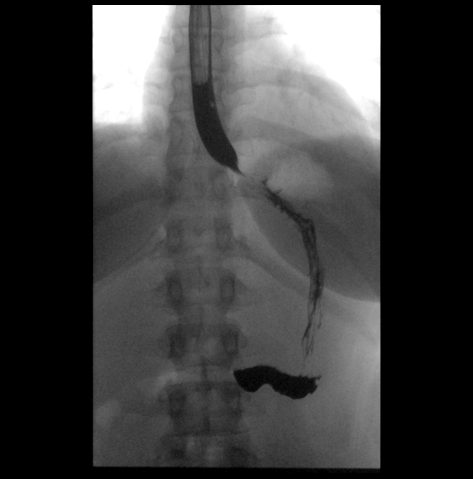

[Series 3: cp_standard · 0.20mm/px · 1 of 1 slices shown (2 of 10)]
[im 1/1]
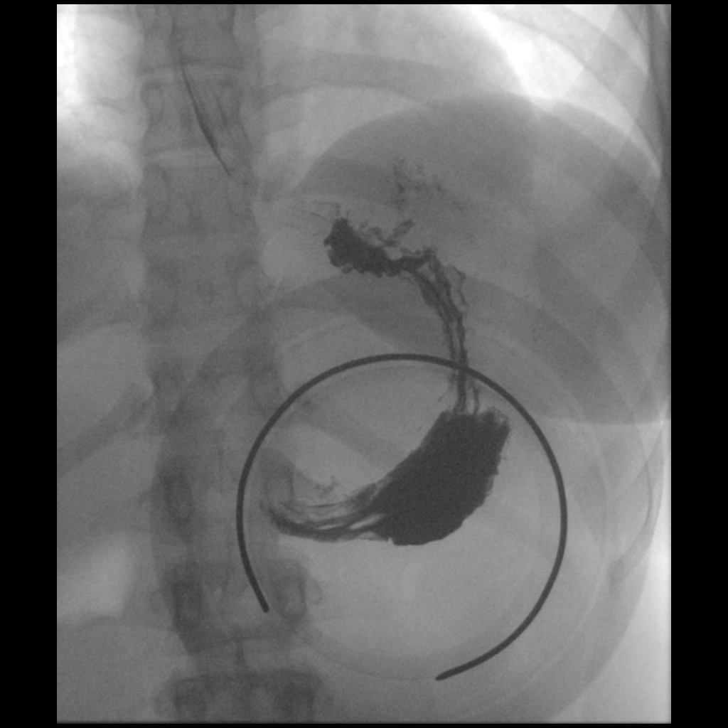

[Series 6: cp_standard · 0.55mm/px · 2 of 64 frames shown (3 of 10)]
[frame 10/64]
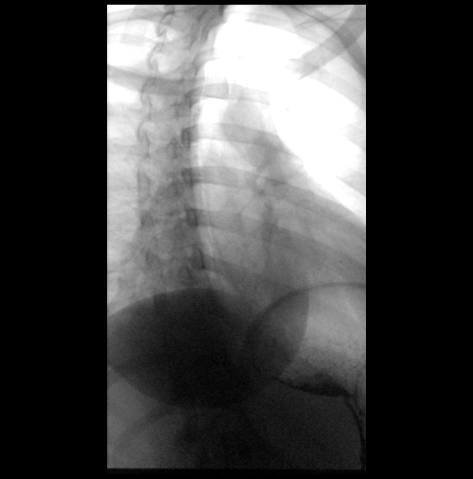
[frame 15/64]
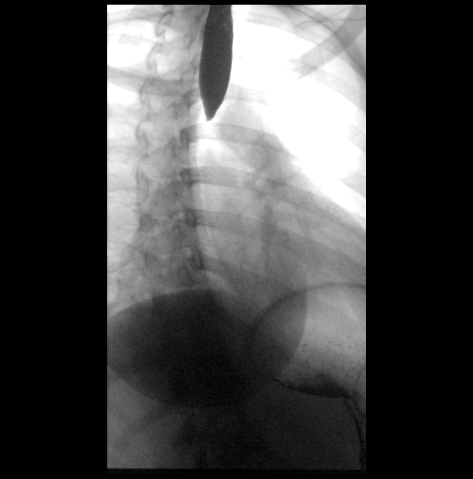

[Series 7: cp_standard · 0.55mm/px · 2 of 64 frames shown (4 of 10)]
[frame 10/64]
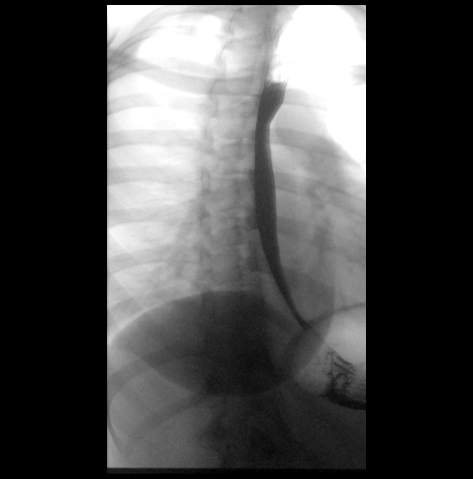
[frame 33/64]
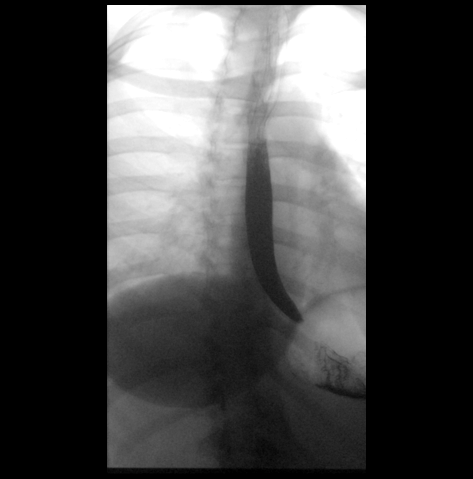

[Series 8: cp_standard · 0.27mm/px · 1 of 1 slices shown (5 of 10)]
[im 1/1]
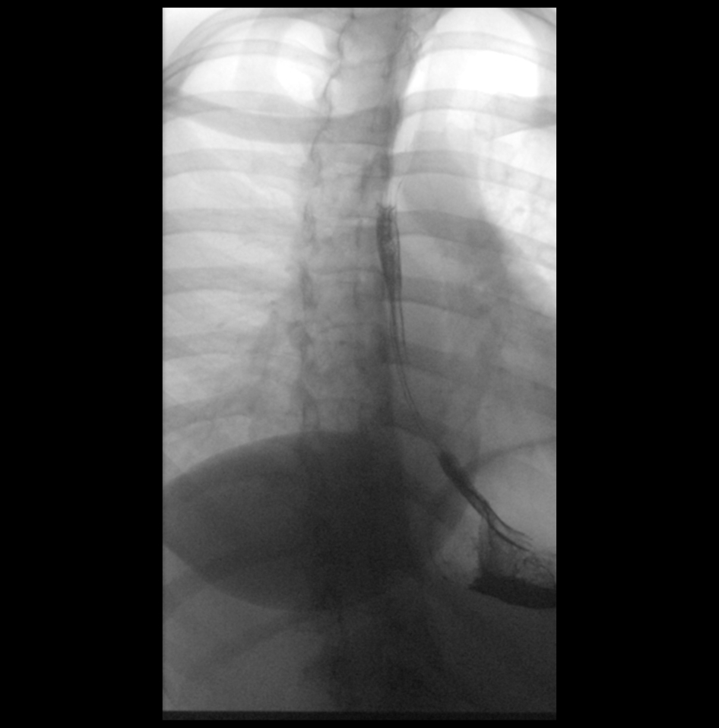

[Series 10: cp_standard · 0.28mm/px · 1 of 1 slices shown (6 of 10)]
[im 1/1]
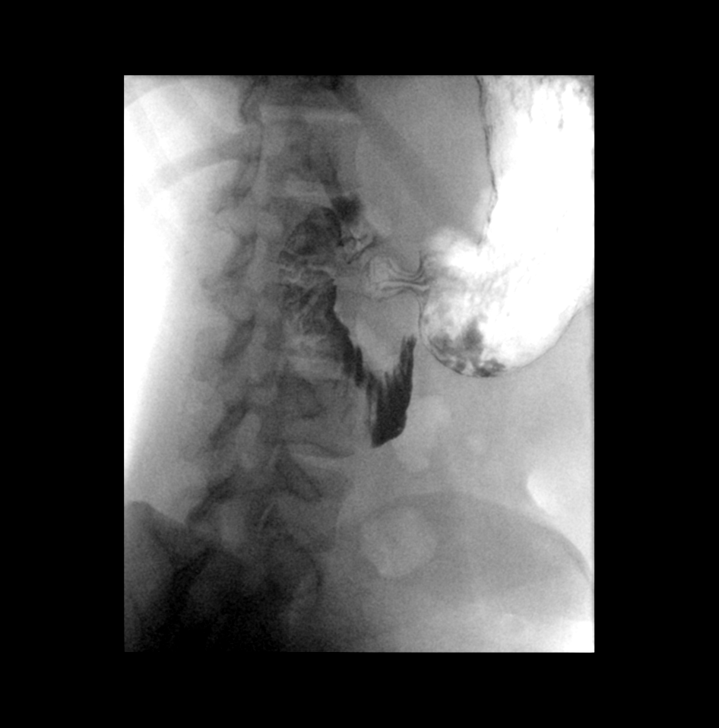

[Series 13: cp_standard · 0.28mm/px · 1 of 1 slices shown (7 of 10)]
[im 1/1]
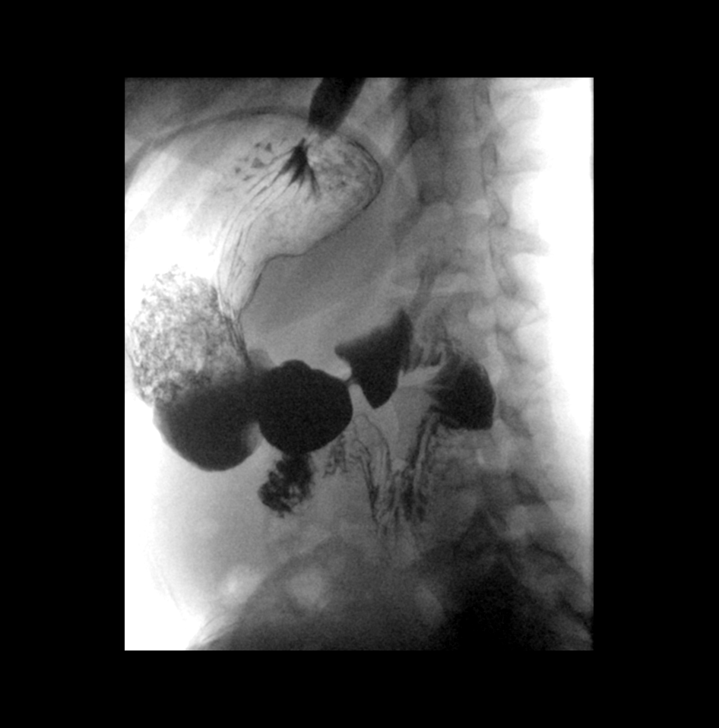

[Series 15: cp_standard · 0.57mm/px · 2 of 113 frames shown (8 of 10)]
[frame 17/113]
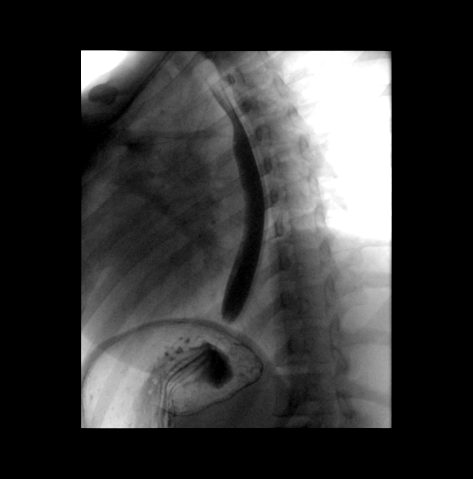
[frame 67/113]
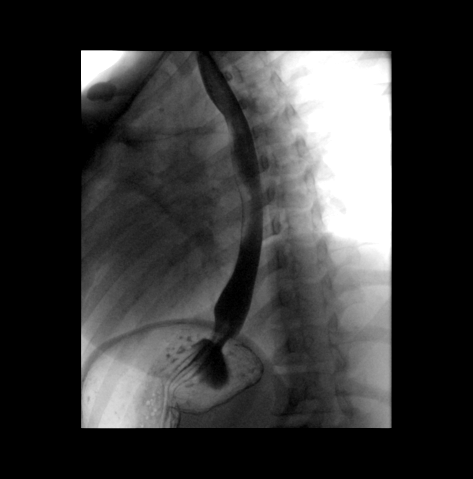

[Series 16: cp_standard · 0.30mm/px · 1 of 1 slices shown (9 of 10)]
[im 1/1]
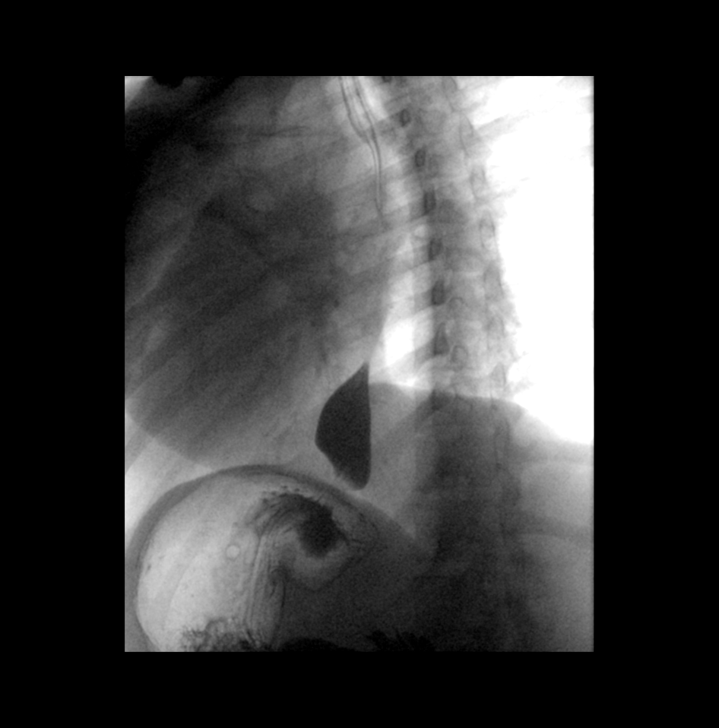

[Series 19: cp_standard · 0.28mm/px · 1 of 1 slices shown (10 of 10)]
[im 1/1]
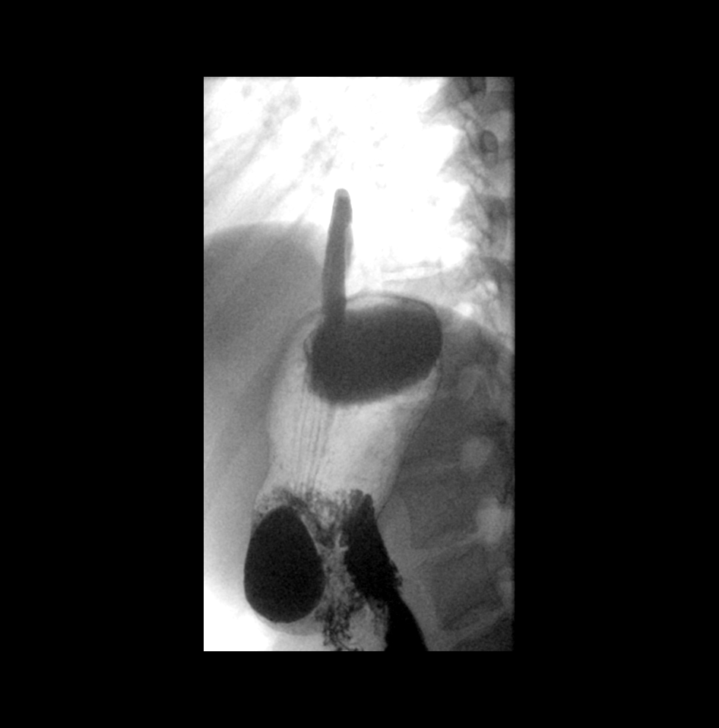

[14 of 24 positions shown; findings below may reference images not displayed]

FINDINGS: Normal esophageal motility. No intrinsic or extrinsic lesions of the
esophagus were identified. No mucosal abnormalities. Very small
sliding-type hiatal hernia with inducible GE reflux with water
swallowing.

The stomach, duodenum bulb and C-loop are normal. Normal mucosal
folds. No ulcer or mass. The duodenal jejunal junction is in its
normal anatomic location.
IMPRESSION: 1. Very small sliding-type hiatal hernia with inducible GE reflux.
2. Normal esophageal motility.  No stricture or mass.
3. Normal appearance of the stomach and duodenum.

## 2021-01-30 ENCOUNTER — Telehealth (INDEPENDENT_AMBULATORY_CARE_PROVIDER_SITE_OTHER): Payer: Self-pay | Admitting: Family Medicine

## 2021-01-30 ENCOUNTER — Encounter: Payer: Self-pay | Admitting: Family Medicine

## 2021-01-30 DIAGNOSIS — K529 Noninfective gastroenteritis and colitis, unspecified: Secondary | ICD-10-CM

## 2021-01-30 MED ORDER — ONDANSETRON 4 MG PO TBDP: 4.0000 mg | ORAL_TABLET | Freq: Three times a day (TID) | ORAL | 0 refills | Status: AC | PRN

## 2021-01-30 NOTE — Progress Notes (Signed)
Virtual Visit via Video Note  I connected with Courtney Avery on 01/30/21 at  4:00 PM EDT by a video enabled telemedicine application 2/2 COVID-19 pandemic and verified that I am speaking with the correct person using two identifiers.  Location patient: home Location provider:work or home office Persons participating in the virtual visit: patient, provider  I discussed the limitations of evaluation and management by telemedicine and the availability of in person appointments. The patient expressed understanding and agreed to proceed.   HPI: Pt with stomach pain, diarrhea, and emesis since Sunday.  Pt had peanuts and was drinking soda which was new for her.  Endorses HA.  Took Tylenol for symptoms.     ROS: See pertinent positives and negatives per HPI.  Past Medical History:  Diagnosis Date   Asthma     Past Surgical History:  Procedure Laterality Date   REPLACEMENT TOTAL KNEE      Family History  Problem Relation Age of Onset   Breast cancer Paternal Aunt        4 aunts   Colon cancer Neg Hx    Esophageal cancer Neg Hx    Rectal cancer Neg Hx    Stomach cancer Neg Hx      Current Outpatient Medications:    albuterol (PROAIR HFA) 108 (90 BASE) MCG/ACT inhaler, Inhale 2 puffs into the lungs every 4 (four) hours as needed for wheezing or shortness of breath., Disp: 1 Inhaler, Rfl: 0   cyclobenzaprine (FLEXERIL) 5 MG tablet, Take 1 tablet (5 mg total) by mouth 2 (two) times daily as needed for muscle spasms., Disp: 30 tablet, Rfl: 0   levonorgestrel (MIRENA, 52 MG,) 20 MCG/DAY IUD, Mirena 20 mcg/24 hours (8 yrs) 52 mg intrauterine device  Take 1 device by intrauterine route., Disp: , Rfl:    pantoprazole (PROTONIX) 40 MG tablet, Take 1 tablet (40 mg total) by mouth 2 (two) times daily before a meal., Disp: 60 tablet, Rfl: 3   levonorgestrel-ethinyl estradiol (ALESSE) 0.1-20 MG-MCG tablet, Take 1 tablet by mouth daily. (Patient not taking: Reported on 01/30/2021), Disp: , Rfl:    EXAM:  VITALS per patient if applicable: RR between 12-20 bpm  GENERAL: alert, oriented, appears well and in no acute distress  HEENT: atraumatic, conjunctiva clear, no obvious abnormalities on inspection of external nose and ears  NECK: normal movements of the head and neck  LUNGS: on inspection no signs of respiratory distress, breathing rate appears normal, no obvious gross SOB, gasping or wheezing  CV: no obvious cyanosis  MS: moves all visible extremities without noticeable abnormality  PSYCH/NEURO: pleasant and cooperative, no obvious depression or anxiety, speech and thought processing grossly intact  ASSESSMENT AND PLAN:  Discussed the following assessment and plan:  Gastroenteritis  -likely viral etiology -supportive care including bland diet- advance as tolerated, small sips of fluids, frequent hand washing, etc. -OTC imodium prn -given strict precautions for continued or worsened symptoms - Plan: ondansetron (ZOFRAN ODT) 4 MG disintegrating tablet  F/u prn  I discussed the assessment and treatment plan with the patient. The patient was provided an opportunity to ask questions and all were answered. The patient agreed with the plan and demonstrated an understanding of the instructions.   The patient was advised to call back or seek an in-person evaluation if the symptoms worsen or if the condition fails to improve as anticipated.  Deeann Saint, MD

## 2022-11-25 ENCOUNTER — Encounter (INDEPENDENT_AMBULATORY_CARE_PROVIDER_SITE_OTHER): Payer: Self-pay

## 2022-12-09 ENCOUNTER — Encounter: Payer: Self-pay | Admitting: Family Medicine

## 2022-12-14 ENCOUNTER — Encounter: Payer: Self-pay | Admitting: Family Medicine

## 2023-01-01 ENCOUNTER — Other Ambulatory Visit: Payer: Self-pay | Admitting: Obstetrics and Gynecology

## 2023-01-01 DIAGNOSIS — E049 Nontoxic goiter, unspecified: Secondary | ICD-10-CM
